# Patient Record
Sex: Male | Born: 1970 | Race: White | Hispanic: No | Marital: Married | Smoking: Never smoker
Health system: Southern US, Community
[De-identification: ages and names within clinical notes are randomized; demographics above are authoritative.]

## PROBLEM LIST (undated history)

## (undated) DIAGNOSIS — K219 Gastro-esophageal reflux disease without esophagitis: Secondary | ICD-10-CM

## (undated) DIAGNOSIS — J45909 Unspecified asthma, uncomplicated: Secondary | ICD-10-CM

## (undated) DIAGNOSIS — I10 Essential (primary) hypertension: Secondary | ICD-10-CM

## (undated) HISTORY — PX: SMALL INTESTINE SURGERY: SHX150

## (undated) HISTORY — PX: APPENDECTOMY: SHX54

---

## 2008-04-15 ENCOUNTER — Inpatient Hospital Stay: Payer: Self-pay | Admitting: Surgery

## 2012-10-02 ENCOUNTER — Emergency Department: Payer: Self-pay | Admitting: Emergency Medicine

## 2012-10-02 LAB — COMPREHENSIVE METABOLIC PANEL
Albumin: 4 g/dL (ref 3.4–5.0)
Anion Gap: 6 — ABNORMAL LOW (ref 7–16)
Chloride: 107 mmol/L (ref 98–107)
Creatinine: 1.46 mg/dL — ABNORMAL HIGH (ref 0.60–1.30)
EGFR (African American): >60
Glucose: 138 mg/dL — ABNORMAL HIGH (ref 65–99)
Osmolality: 278 (ref 275–301)
Potassium: 4.2 mmol/L (ref 3.5–5.1)
SGOT(AST): 25 U/L (ref 15–37)
SGPT (ALT): 38 U/L (ref 12–78)
Total Protein: 8.4 g/dL — ABNORMAL HIGH (ref 6.4–8.2)

## 2012-10-02 LAB — URINALYSIS, COMPLETE
Bilirubin,UR: NEGATIVE
Glucose,UR: NEGATIVE mg/dL (ref 0–75)
Ketone: NEGATIVE
Leukocyte Esterase: NEGATIVE
Ph: 5 (ref 4.5–8.0)
RBC,UR: 10 /HPF (ref 0–5)
Squamous Epithelial: NONE SEEN

## 2012-10-02 LAB — CBC
MCH: 29.7 pg (ref 26.0–34.0)
MCV: 85 fL (ref 80–100)
Platelet: 207 10*3/uL (ref 150–440)
RBC: 5.29 10*6/uL (ref 4.40–5.90)
RDW: 12.9 % (ref 11.5–14.5)
WBC: 7 10*3/uL (ref 3.8–10.6)

## 2014-07-12 ENCOUNTER — Ambulatory Visit: Payer: Self-pay | Admitting: Physician Assistant

## 2014-09-07 IMAGING — CT CT ABD-PELV W/ CM
1 of 2 series · 15 of 32 positions shown, 19 images · IV contrast (isovue)
Comparison: none

REASON FOR EXAM: (1) abd pain - R side - hx of abscesses/fistulas in the
past.  Hx of bowel resec
COMMENTS:

PROCEDURE:     CT  - CT ABDOMEN / PELVIS  W  - October 02, 2012 [DATE]
RESULT:     Comparison:  04/15/2008
TECHNIQUE: Multiple axial images of the abdomen and pelvis were performed
from the lung bases to the pubic symphysis, with p.o. contrast and with 85
mL of Isovue 370 intravenous contrast.

[Series 2: 3mm soft tissue · axial · 0.88mm/px · z∈[-911,-476]mm · 15 of 159 slices shown, 19 images]
[im 7/159  soft-tissue]
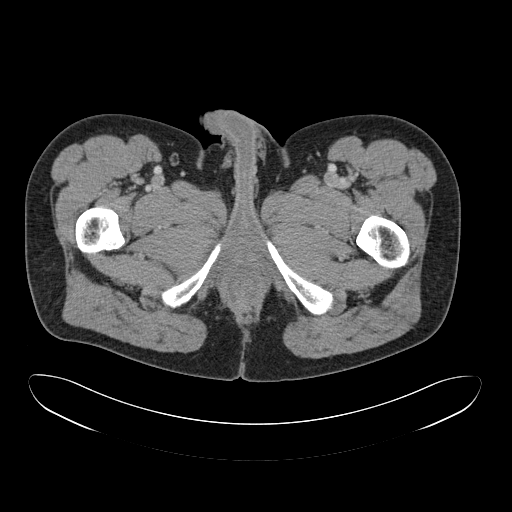
[im 7/159  bone]
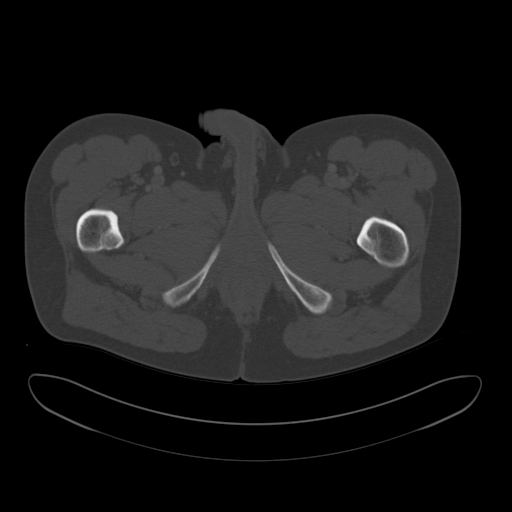
[im 21/159  soft-tissue]
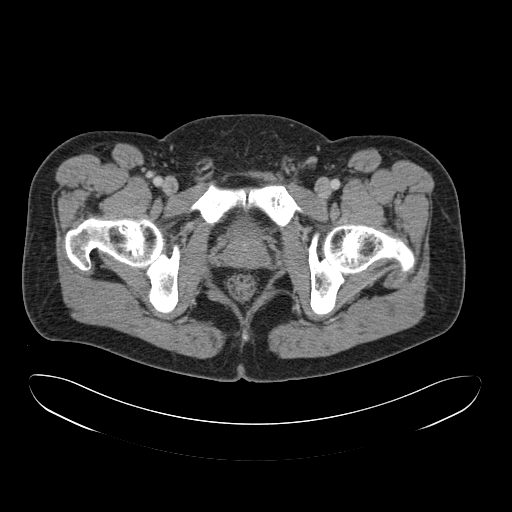
[im 35/159  soft-tissue]
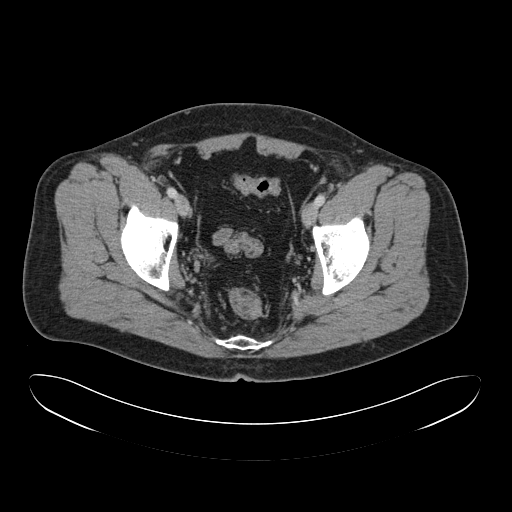
[im 42/159  soft-tissue]
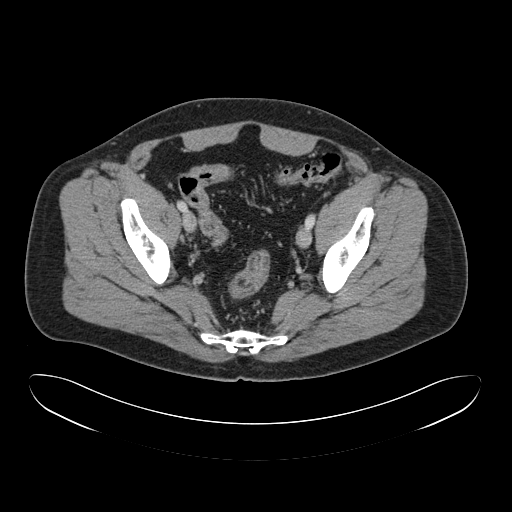
[im 55/159  soft-tissue]
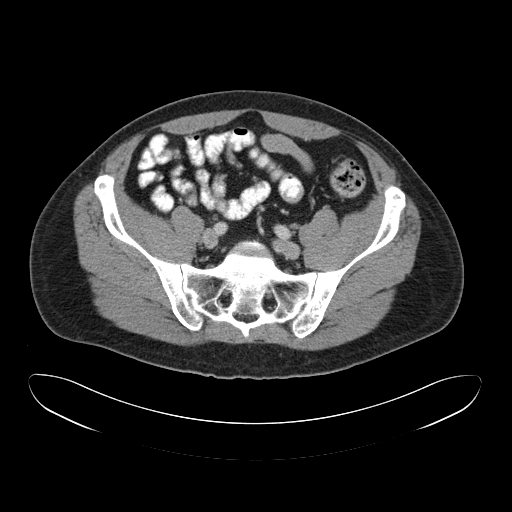
[im 69/159  soft-tissue]
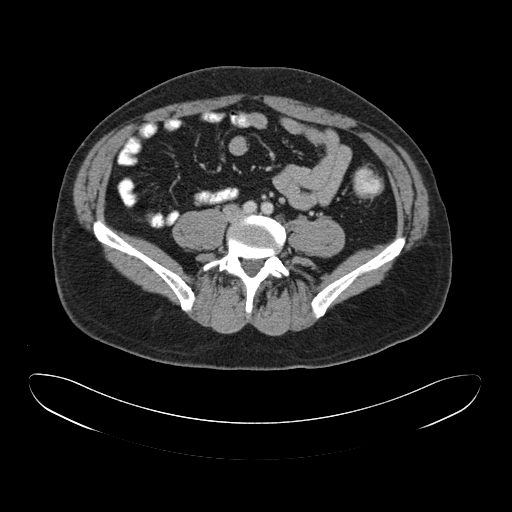
[im 83/159  soft-tissue]
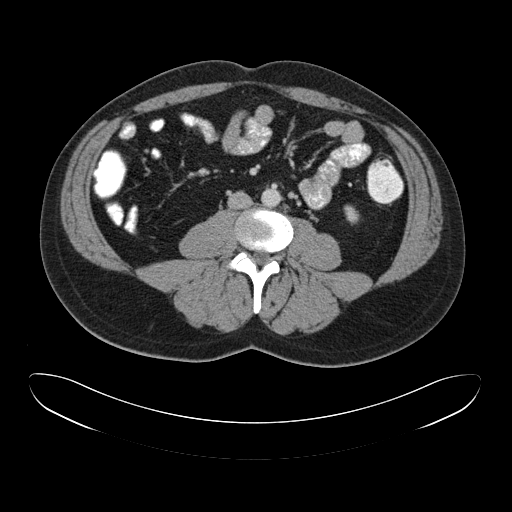
[im 90/159  soft-tissue]
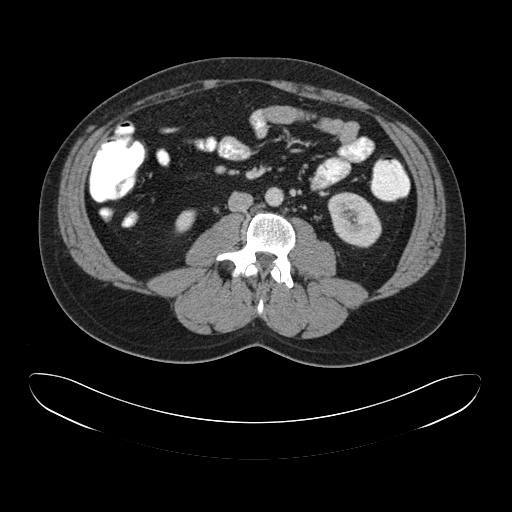
[im 104/159  soft-tissue]
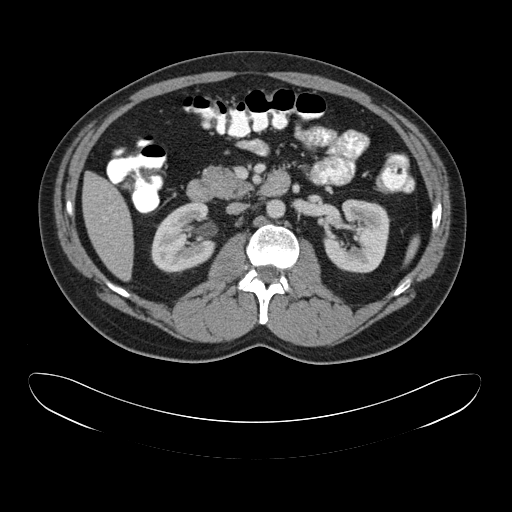
[im 104/159  bone]
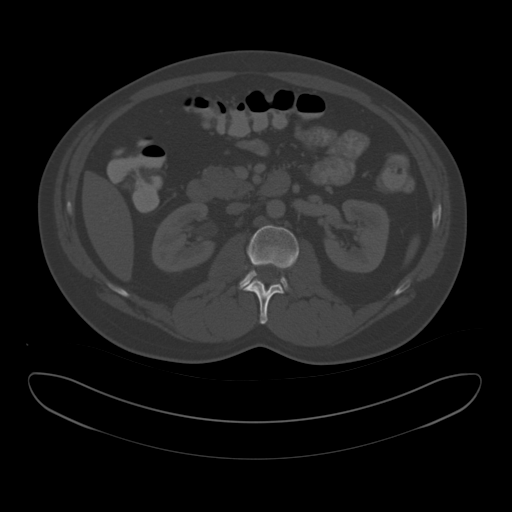
[im 117/159  soft-tissue]
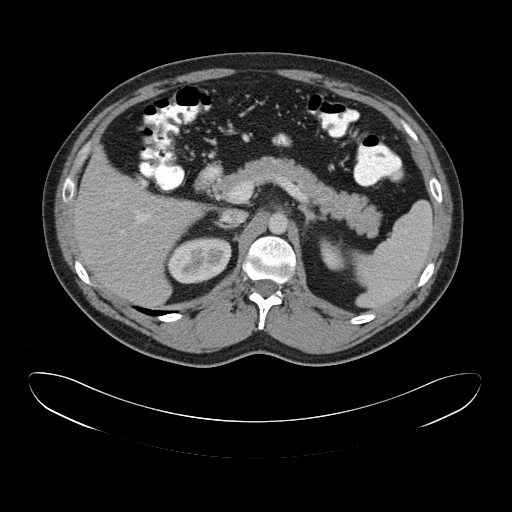
[im 124/159  soft-tissue]
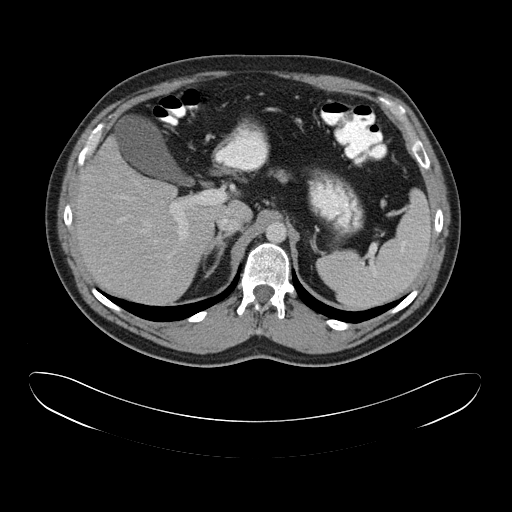
[im 131/159  lung]
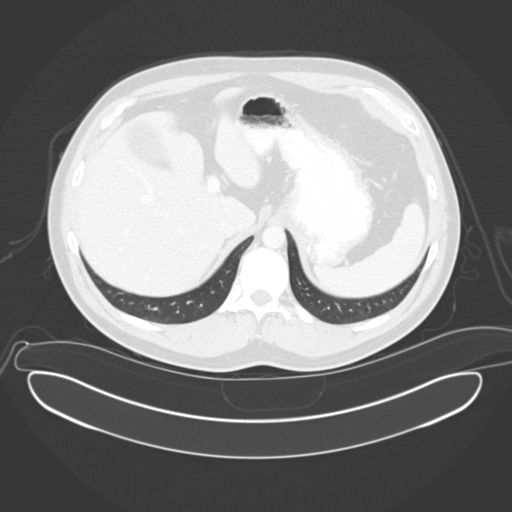
[im 138/159  soft-tissue]
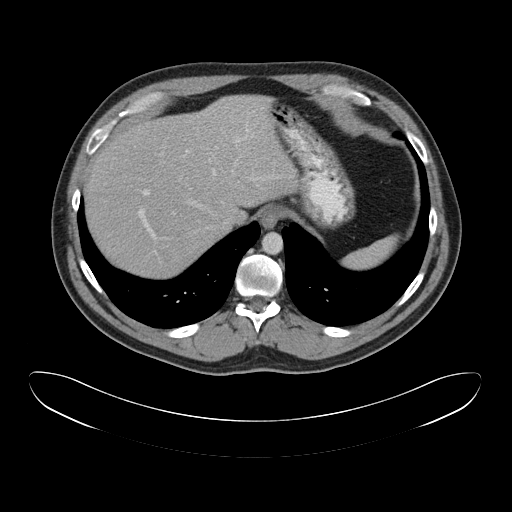
[im 138/159  lung]
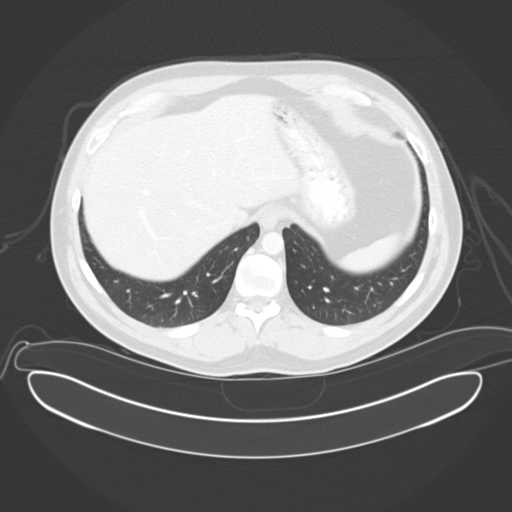
[im 145/159  lung]
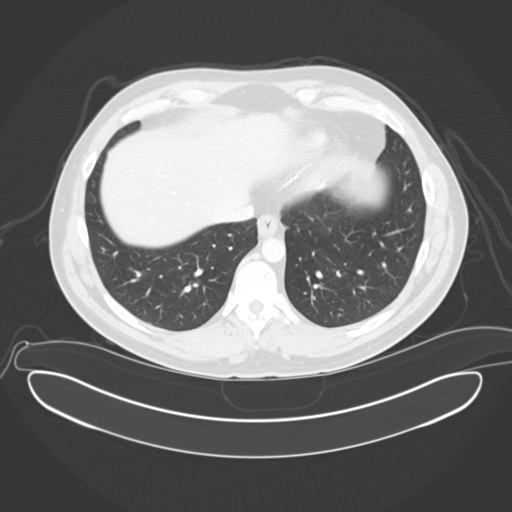
[im 152/159  soft-tissue]
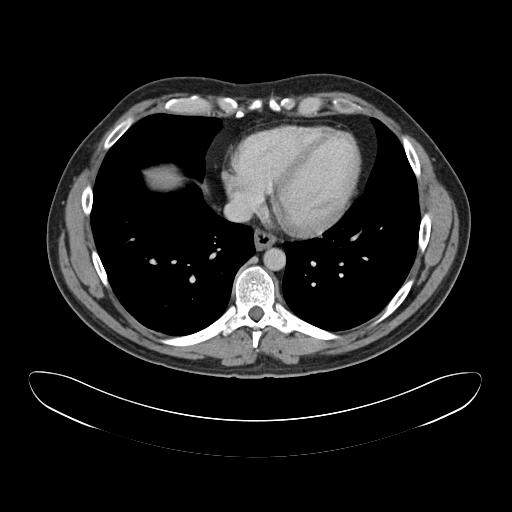
[im 152/159  lung]
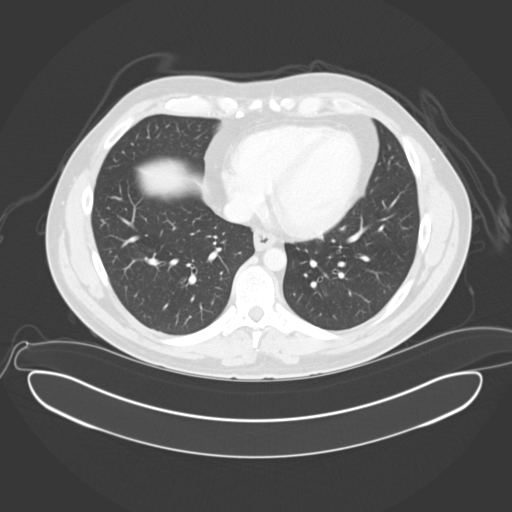

[15 of 32 positions shown; findings below may reference images not displayed]

FINDINGS: There is a 5 mm nodule in the right lower lobe which appears increased in
size from prior.

The liver, gallbladder, spleen, adrenals, and pancreas are unremarkable. The
kidneys enhance normally. There is a 4 mm calculus in the proximal right
ureter with minimal dilatation of the proximal right ureter and renal
pelvis.

The small and large bowel are normal in caliber. The appendix is not
visualized. However, there are no inflammatory changes at the base of the
cecum.

No aggressive lytic or sclerotic osseous lesions are identified.
IMPRESSION: 1. There is a 4 mm calculus in the proximal right ureter causing minimal
dilatation of the proximal right ureter and renal pelvis.
2. There is a 5 mm nodule in the right lower lobe which appears increased in
conspicuity from prior, though this could be secondary to differences in
slice selection. Followup noncontrast chest CT is recommended in 3-6 months.

## 2016-12-06 ENCOUNTER — Emergency Department
Admission: EM | Admit: 2016-12-06 | Discharge: 2016-12-06 | Disposition: A | Discharge status: Home / Self Care | Payer: BLUE CROSS/BLUE SHIELD | Attending: Emergency Medicine | Admitting: Emergency Medicine

## 2016-12-06 ENCOUNTER — Emergency Department: Payer: BLUE CROSS/BLUE SHIELD

## 2016-12-06 ENCOUNTER — Encounter: Payer: Self-pay | Admitting: Emergency Medicine

## 2016-12-06 DIAGNOSIS — M25511 Pain in right shoulder: Secondary | ICD-10-CM | POA: Insufficient documentation

## 2016-12-06 HISTORY — DX: Unspecified asthma, uncomplicated: J45.909

## 2016-12-06 MED ORDER — OXYCODONE-ACETAMINOPHEN 5-325 MG PO TABS
1.0000 | ORAL_TABLET | Freq: Once | ORAL | Status: AC
  Administered 2016-12-06: 1 via ORAL

## 2016-12-06 MED ORDER — OXYCODONE-ACETAMINOPHEN 5-325 MG PO TABS
ORAL_TABLET | ORAL | Status: AC
  Filled 2016-12-06: qty 1

## 2016-12-06 MED ORDER — LIDOCAINE 5 % EX PTCH
MEDICATED_PATCH | CUTANEOUS | Status: AC
  Administered 2016-12-06: 1
  Filled 2016-12-06: qty 1

## 2016-12-06 MED ORDER — KETOROLAC TROMETHAMINE 10 MG PO TABS: 10.0000 mg | ORAL_TABLET | Freq: Four times a day (QID) | ORAL | 0 refills | Status: AC | PRN

## 2016-12-06 MED ORDER — LIDOCAINE 5 % EX PTCH
2.0000 | MEDICATED_PATCH | CUTANEOUS | Status: DC
  Administered 2016-12-06: 1 via TRANSDERMAL
  Filled 2016-12-06: qty 2

## 2016-12-06 NOTE — ED Provider Notes (Signed)
[facility] Emergency Department Provider Note   First MD Initiated Contact with Patient 12/06/16 [phone]     (approximate)  I have reviewed the triage vital signs and the nursing notes.   HISTORY  Chief Complaint Shoulder Pain    HPI Randy Jacobs is a 46 y.o. male presents with7 out of 10 nontraumatic right shoulder pain 2 days. Patient denies any dyspnea no chest pain. Patient states the pain is worse with any movement of the right shoulder. Patient admits to taken ibuprofen before arrival with improvement of pain. Patient denies any known injury   Past Medical History:  Diagnosis Date  . Asthma     There are no active problems to display for this patient.   Past Surgical History:  Procedure Laterality Date  . SMALL INTESTINE SURGERY      Prior to Admission medications   Medication Sig Start Date End Date Taking? Authorizing Provider  ketorolac (TORADOL) 10 MG tablet Take 1 tablet (10 mg total) by mouth every 6 (six) hours as needed. 12/06/16   Gregor Hams, MD    Allergies No known drug allergies No family history on file.  Social History Social History  Substance Use Topics  . Smoking status: Never Smoker  . Smokeless tobacco: Never Used  . Alcohol use No    Review of Systems Constitutional: No fever/chills Eyes: No visual changes. ENT: No sore throat. Cardiovascular: Denies chest pain. Respiratory: Denies shortness of breath. Gastrointestinal: No abdominal pain.  No nausea, no vomiting.  No diarrhea.  No constipation. Genitourinary: Negative for dysuria. Musculoskeletal: Negative for neck pain.  Negative for back pain.Positive for right shoulder pain Integumentary: Negative for rash. Neurological: Negative for headaches, focal weakness or numbness.   ____________________________________________   PHYSICAL EXAM:  VITAL SIGNS: ED Triage Vitals  Enc Vitals Group     BP 12/06/16 0143 140/88     Pulse Rate  12/06/16 0143 83     Resp 12/06/16 0143 18     Temp 12/06/16 0143 99.1 F (37.3 C)     Temp Source 12/06/16 0143 Oral     SpO2 12/06/16 0143 97 %     Weight 12/06/16 0144 83.9 kg (185 lb)     Height 12/06/16 0144 1.727 m (5\' 8" )     Head Circumference --      Peak Flow --      Pain Score 12/06/16 0143 7     Pain Loc --      Pain Edu? --      Excl. in [place]? --     Constitutional: Alert and oriented. Apparent discomfort Mouth/Throat: Mucous membranes are moist. Neck: No stridor.   Cardiovascular: Normal rate, regular rhythm. Good peripheral circulation. Grossly normal heart sounds. Respiratory: Normal respiratory effort.  No retractions. Lungs CTAB. Gastrointestinal: Soft and nontender. No distention.  Musculoskeletal: Pain with palpation of the anterior portion of the right shoulder in the area of the subscapularis Skin:  Skin is warm, dry and intact. No rash noted. Psychiatric: Mood and affect are normal. Speech and behavior are normal.    RADIOLOGY I, [place], personally viewed and evaluated these images (plain radiographs) as part of my medical decision making, as well as reviewing the written report by the radiologist.  Dg Shoulder Right  Result Date: 12/06/2016 CLINICAL DATA:  [name] right shoulder pain since Tuesday. No known injury. EXAM: RIGHT SHOULDER - 2+ VIEW COMPARISON:  None. FINDINGS: There is no evidence of fracture or  dislocation. There is no evidence of arthropathy or other focal bone abnormality. Soft tissues are unremarkable. IMPRESSION: Negative. Electronically Signed   By: Lucienne Capers M.D.   On: 12/06/2016 02:19      Procedures   ____________________________________________   INITIAL IMPRESSION / ASSESSMENT AND PLAN / ED COURSE  Pertinent labs & imaging results that were available during my care of the patient were reviewed by me and considered in my medical decision making (see chart for details).  46 year old male presenting with  anterior right shoulder pain. History physical exam concerning for possible rotator cuff injury versus bursitis. Lidoderm patch applied patient given Percocet in the emergency department. We'll prescribe Toradol for home. Patient will be referred to orthopedic surgery for further outpatient evaluation and management.      ____________________________________________  FINAL CLINICAL IMPRESSION(S) / ED DIAGNOSES  Final diagnoses:  Acute pain of right shoulder     MEDICATIONS GIVEN DURING THIS VISIT:  Medications  lidocaine (LIDODERM) 5 % (not administered)  lidocaine (LIDODERM) 5 % 2 patch (1 patch Transdermal Patch Applied 12/06/16 0345)  oxyCODONE-acetaminophen (PERCOCET/ROXICET) 5-325 MG per tablet 1 tablet (1 tablet Oral Given 12/06/16 0343)     NEW OUTPATIENT MEDICATIONS STARTED DURING THIS VISIT:  New Prescriptions   KETOROLAC (TORADOL) 10 MG TABLET    Take 1 tablet (10 mg total) by mouth every 6 (six) hours as needed.    Modified Medications   No medications on file    Discontinued Medications   No medications on file     Note:  This document was prepared using Dragon voice recognition software and may include unintentional dictation errors.    Gregor Hams, MD 12/06/16 [phone]

## 2016-12-06 NOTE — ED Triage Notes (Signed)
Patient ambulatory to triage with steady gait, without difficulty or distress noted; pt reports since Tuesday having sharp pain to right shoulder; denies hx of same; denies any known injury st pain with ROM

## 2018-11-11 IMAGING — CR DG SHOULDER 2+V*R*
3 series · 3 of 3 positions shown · non-contrast
Comparison: None.

CLINICAL DATA: Sharp right shoulder pain since [REDACTED]. No known
injury.

EXAM:
RIGHT SHOULDER - 2+ VIEW

[shoulder grashey]
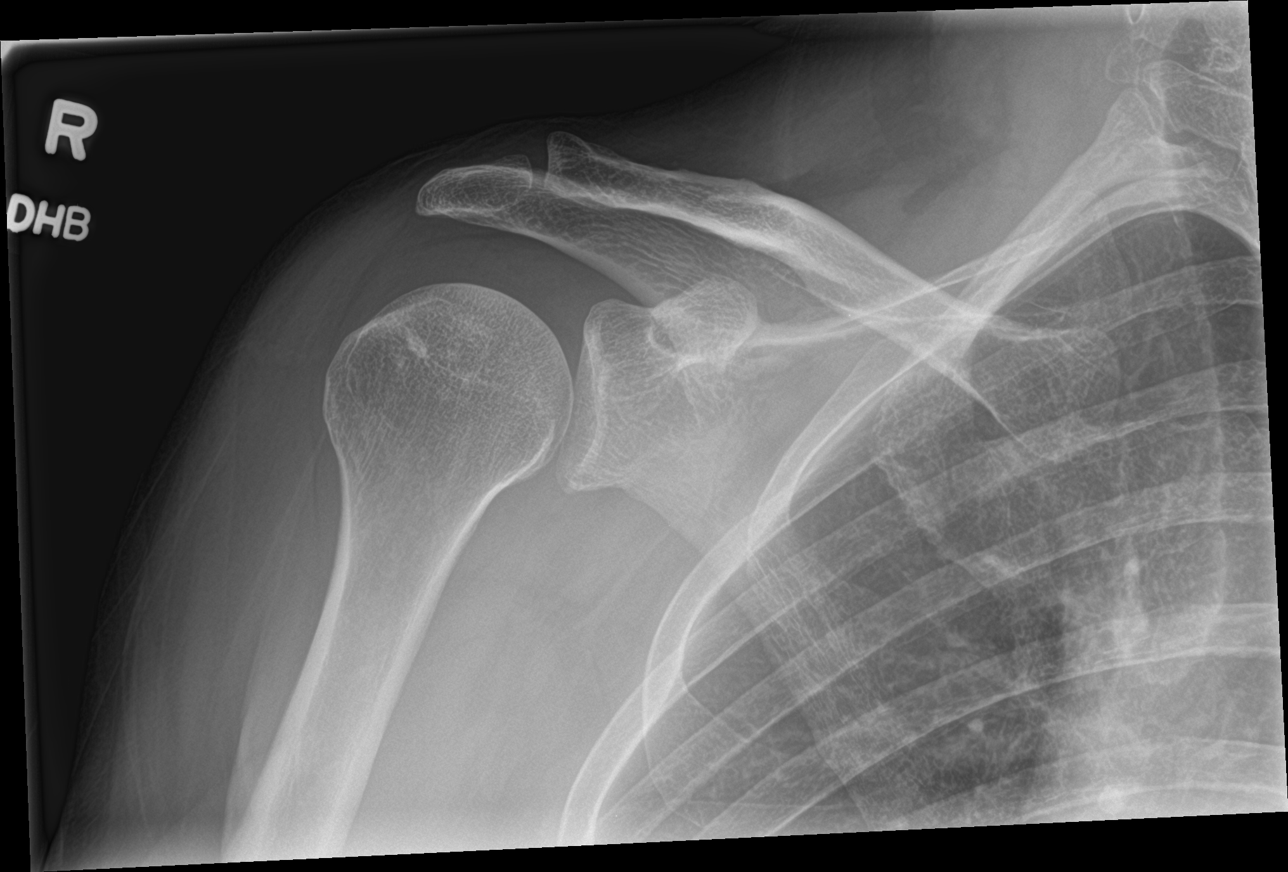

[shoulder y view]
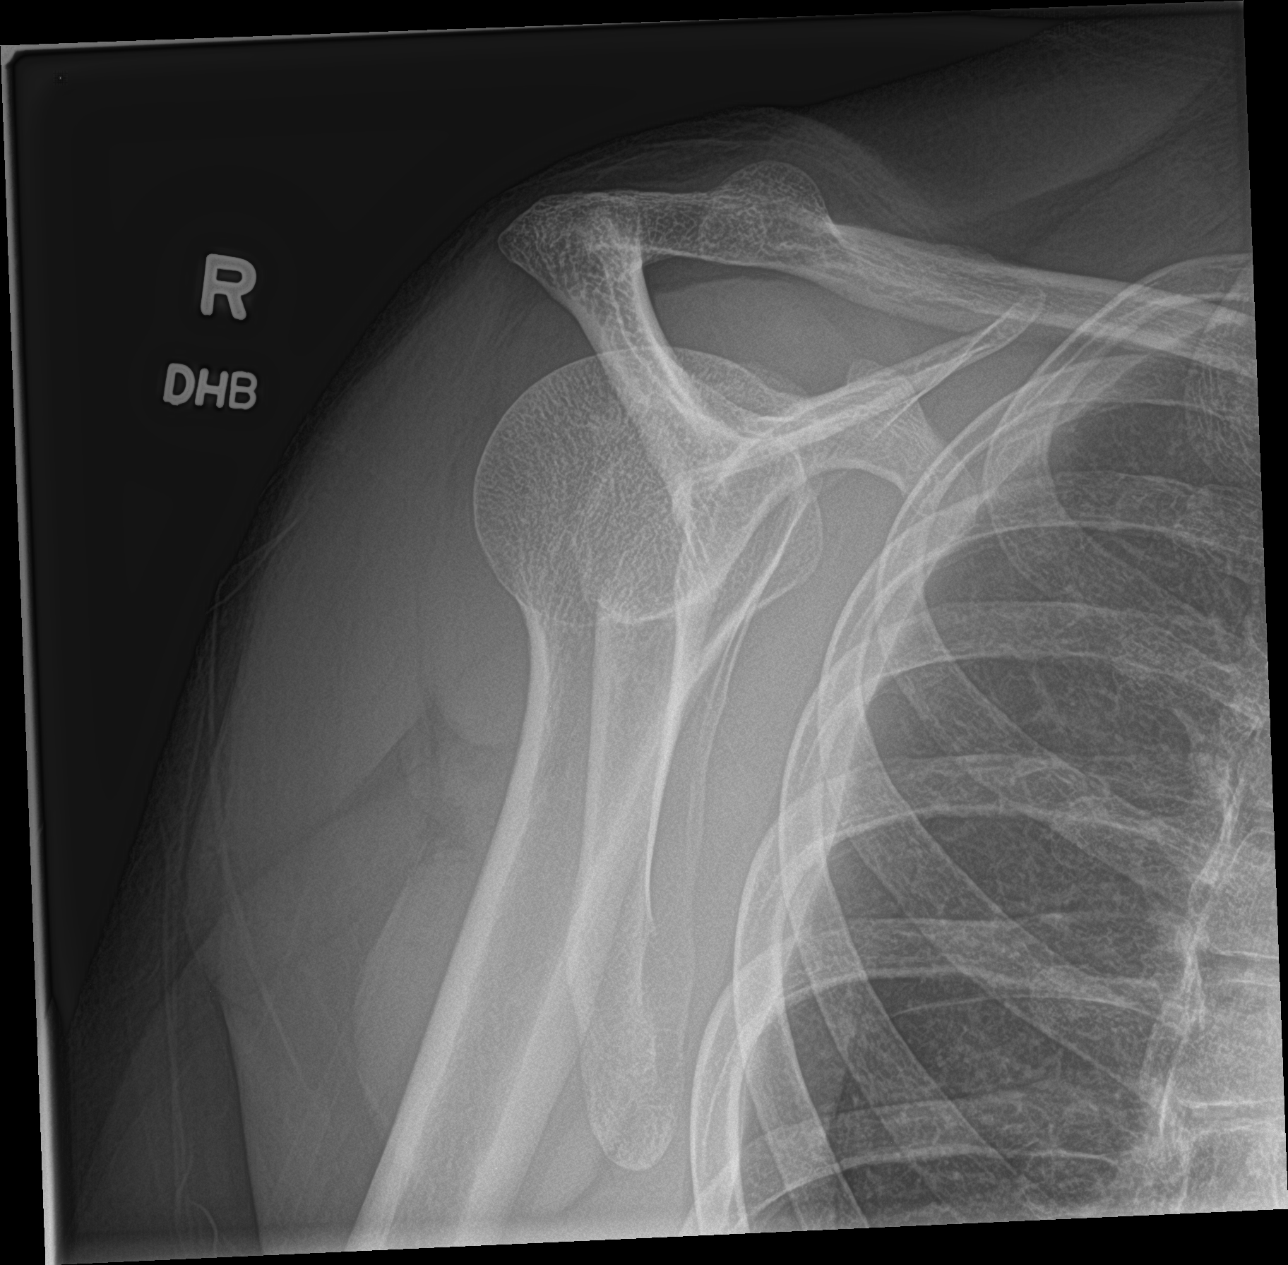

[shoulder axillary]
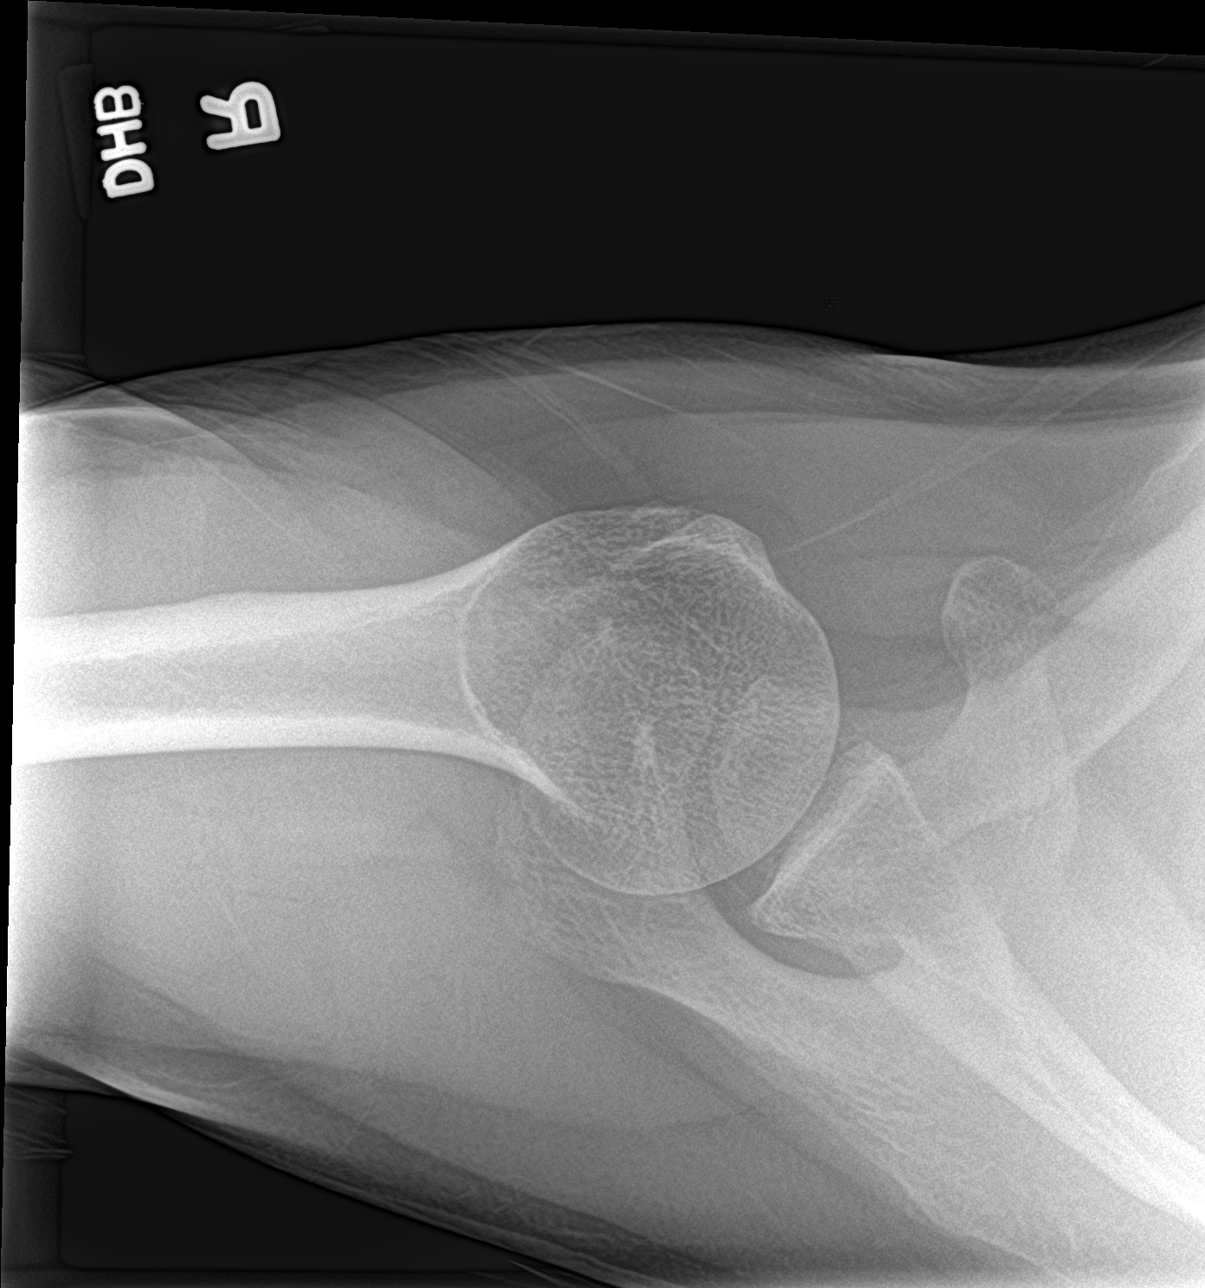

[3 of 3 positions shown; findings below may reference images not displayed]

FINDINGS: There is no evidence of fracture or dislocation. There is no
evidence of arthropathy or other focal bone abnormality. Soft
tissues are unremarkable.
IMPRESSION: Negative.

## 2020-09-22 ENCOUNTER — Encounter: Payer: Self-pay | Admitting: *Deleted

## 2020-09-23 ENCOUNTER — Other Ambulatory Visit: Payer: Self-pay

## 2020-09-23 ENCOUNTER — Encounter
Admission: RE | Disposition: A | Discharge status: Home / Self Care | Payer: Self-pay | Source: Home / Self Care | Attending: Gastroenterology

## 2020-09-23 ENCOUNTER — Ambulatory Visit: Payer: Managed Care, Other (non HMO) | Admitting: Certified Registered"

## 2020-09-23 ENCOUNTER — Ambulatory Visit
Admission: RE | Admit: 2020-09-23 | Discharge: 2020-09-23 | Disposition: A | Discharge status: Home / Self Care | Payer: Managed Care, Other (non HMO) | Attending: Gastroenterology | Admitting: Gastroenterology

## 2020-09-23 ENCOUNTER — Encounter: Payer: Self-pay | Admitting: *Deleted

## 2020-09-23 DIAGNOSIS — Z79899 Other long term (current) drug therapy: Secondary | ICD-10-CM | POA: Insufficient documentation

## 2020-09-23 DIAGNOSIS — Z980 Intestinal bypass and anastomosis status: Secondary | ICD-10-CM | POA: Insufficient documentation

## 2020-09-23 DIAGNOSIS — D123 Benign neoplasm of transverse colon: Secondary | ICD-10-CM | POA: Diagnosis not present

## 2020-09-23 DIAGNOSIS — D125 Benign neoplasm of sigmoid colon: Secondary | ICD-10-CM | POA: Diagnosis not present

## 2020-09-23 DIAGNOSIS — K635 Polyp of colon: Secondary | ICD-10-CM | POA: Insufficient documentation

## 2020-09-23 DIAGNOSIS — Z79890 Hormone replacement therapy (postmenopausal): Secondary | ICD-10-CM | POA: Diagnosis not present

## 2020-09-23 DIAGNOSIS — K6389 Other specified diseases of intestine: Secondary | ICD-10-CM | POA: Diagnosis not present

## 2020-09-23 DIAGNOSIS — Z1211 Encounter for screening for malignant neoplasm of colon: Secondary | ICD-10-CM | POA: Diagnosis present

## 2020-09-23 HISTORY — PX: COLONOSCOPY WITH PROPOFOL: SHX5780

## 2020-09-23 HISTORY — DX: Gastro-esophageal reflux disease without esophagitis: K21.9

## 2020-09-23 HISTORY — DX: Essential (primary) hypertension: I10

## 2020-09-23 SURGERY — COLONOSCOPY WITH PROPOFOL
Anesthesia: General

## 2020-09-23 MED ORDER — LIDOCAINE HCL (CARDIAC) PF 100 MG/5ML IV SOSY
PREFILLED_SYRINGE | INTRAVENOUS | Status: DC | PRN
  Administered 2020-09-23: 40 mg via INTRAVENOUS

## 2020-09-23 MED ORDER — PROPOFOL 10 MG/ML IV BOLUS
INTRAVENOUS | Status: DC | PRN
  Administered 2020-09-23: 80 mg via INTRAVENOUS
  Administered 2020-09-23: 60 mg via INTRAVENOUS

## 2020-09-23 MED ORDER — PROPOFOL 500 MG/50ML IV EMUL
INTRAVENOUS | Status: DC | PRN
  Administered 2020-09-23: 125 ug/kg/min via INTRAVENOUS

## 2020-09-23 MED ORDER — SODIUM CHLORIDE 0.9 % IV SOLN
INTRAVENOUS | Status: DC
  Administered 2020-09-23: 1000 mL via INTRAVENOUS

## 2020-09-23 NOTE — Transfer of Care (Signed)
Immediate Anesthesia Transfer of Care Note  Patient: Randy Jacobs  Procedure(s) Performed: COLONOSCOPY WITH PROPOFOL (N/A )  Patient Location: PACU  Anesthesia Type:General  Level of Consciousness: awake, alert  and oriented  Airway & Oxygen Therapy: Patient Spontanous Breathing  Post-op Assessment: Report given to RN and Post -op Vital signs reviewed and stable  Post vital signs: Reviewed and stable  Last Vitals:  Vitals Value Taken Time  BP 125/93 09/23/20 1500  Temp    Pulse 95 09/23/20 1501  Resp 17 09/23/20 1501  SpO2 98 % 09/23/20 1501  Vitals shown include unvalidated device data.  Last Pain:  Vitals:   09/23/20 1359  TempSrc: Temporal  PainSc: 0-No pain      Patients Stated Pain Goal: 0 (35/70/17 7939)  Complications: No complications documented.

## 2020-09-23 NOTE — H&P (Signed)
Outpatient short stay form Pre-procedure 09/23/2020 2:27 PM Raylene Miyamoto MD, MPH  Primary Physician: Dr. 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 No diagnosis of crohns. Here for screening colonoscopy. No family history of GI malignancies. 7 perianal surgeries for fistulas. Has chronic loose stools. No blood thinners. No significant symptoms.    Current Facility-Administered Medications:  .  0.9 %  sodium chloride infusion, , Intravenous, Continuous, Martita Brumm, Hilton Cork, MD, Last Rate: 20 mL/hr at 09/23/20 1421, 1,000 mL at 09/23/20 1421  Medications Prior to Admission  Medication Sig Dispense Refill Last Dose  . albuterol (VENTOLIN HFA) 108 (90 Base) MCG/ACT inhaler Inhale 2 puffs into the lungs every 6 (six) hours as needed for wheezing or shortness of breath.   Past Week at Unknown time  . fluticasone (FLONASE) 50 MCG/ACT nasal spray Place 2 sprays into both nostrils daily.   09/22/2020 at 0900 pm  . levothyroxine (SYNTHROID) 75 MCG tablet Take 75 mcg by mouth daily before breakfast.   09/23/2020 at 0700am  . omeprazole (PRILOSEC) 10 MG capsule Take 10 mg by mouth every 36 (thirty-six) hours.   Past Week at Unknown time  . fluticasone-salmeterol (ADVAIR) 100-50 MCG/ACT AEPB Inhale 1 puff into the lungs 2 (two) times daily. (Patient not taking: Reported on 09/23/2020)   Completed Course at Unknown time  . ketorolac (TORADOL) 10 MG tablet Take 1 tablet (10 mg total) by mouth every 6 (six) hours as needed. 20 tablet 0      No Known Allergies   Past Medical History:  Diagnosis Date  . Asthma   . GERD (gastroesophageal reflux disease)   . Hypertension     Review of systems:  Otherwise negative.    Physical Exam  Gen: Alert, oriented. Appears stated age.  HEENT: PERRLA. Lungs: No respiratory distress CV: RRR Abd: soft,  benign, no masses Ext: No edema    Planned procedures: Proceed with colonoscopy. The patient understands the nature of the planned procedure, indications, risks, alternatives and potential complications including but not limited to bleeding, infection, perforation, damage to internal organs and possible oversedation/side effects from anesthesia. The patient agrees and gives consent to proceed.  Please refer to procedure notes for findings, recommendations and patient disposition/instructions.     Raylene Miyamoto MD, MPH Gastroenterology 09/23/2020  2:27 PM

## 2020-09-23 NOTE — Anesthesia Preprocedure Evaluation (Signed)
Anesthesia Evaluation  Patient identified by MRN, date of birth, ID band Patient awake    Reviewed: Allergy & Precautions, NPO status , Patient's Chart, lab work & pertinent test results  Airway Mallampati: II  TM Distance: >3 FB     Dental   Pulmonary asthma ,    Pulmonary exam normal        Cardiovascular hypertension, Normal cardiovascular exam     Neuro/Psych negative neurological ROS  negative psych ROS   GI/Hepatic Neg liver ROS, GERD  ,  Endo/Other  negative endocrine ROS  Renal/GU negative Renal ROS  negative genitourinary   Musculoskeletal negative musculoskeletal ROS (+)   Abdominal   Peds negative pediatric ROS (+)  Hematology negative hematology ROS (+)   Anesthesia Other Findings Past Medical History: No date: Asthma No date: GERD (gastroesophageal reflux disease) No date: Hypertension  Reproductive/Obstetrics                             Anesthesia Physical Anesthesia Plan  ASA: II  Anesthesia Plan: General   Post-op Pain Management:    Induction: Intravenous  PONV Risk Score and Plan: Propofol infusion  Airway Management Planned: Nasal Cannula  Additional Equipment:   Intra-op Plan:   Post-operative Plan:   Informed Consent: I have reviewed the patients History and Physical, chart, labs and discussed the procedure including the risks, benefits and alternatives for the proposed anesthesia with the patient or authorized representative who has indicated his/her understanding and acceptance.     Dental advisory given  Plan Discussed with: CRNA and Surgeon  Anesthesia Plan Comments:         Anesthesia Quick Evaluation

## 2020-09-23 NOTE — Interval H&P Note (Signed)
History and Physical Interval Note:  09/23/2020 2:30 PM  Randy Jacobs  has presented today for surgery, with the diagnosis of SCREENING.  The various methods of treatment have been discussed with the patient and family. After consideration of risks, benefits and other options for treatment, the patient has consented to  Procedure(s): COLONOSCOPY WITH PROPOFOL (N/A) as a surgical intervention.  The patient's history has been reviewed, patient examined, no change in status, stable for surgery.  I have reviewed the patient's chart and labs.  Questions were answered to the patient's satisfaction.     Lesly Rubenstein  Ok to proceed with colonoscopy

## 2020-09-23 NOTE — Op Note (Signed)
[facility] Gastroenterology Patient Name: Randy Jacobs Procedure Date: 09/23/2020 2:27 PM MRN: [id number] Account #: [id number] Date of Birth: November 20, 1970 Admit Type: Outpatient Age: 50 Room: [facility] ENDO ROOM 3 Gender: Male Note Status: Finalized Procedure:             Colonoscopy Indications:           Screening for colorectal malignant neoplasm Providers:             Andrey Farmer MD, MD Referring MD:          Ocie Cornfield. Ouida Sills MD, MD (Referring MD) Medicines:             Monitored Anesthesia Care Complications:         No immediate complications. Estimated blood loss:                         Minimal. Procedure:             Pre-Anesthesia Assessment:                        - Prior to the procedure, a History and Physical was                         performed, and patient medications and allergies were                         reviewed. The patient is competent. The risks and                         benefits of the procedure and the sedation options and                         risks were discussed with the patient. All questions                         were answered and informed consent was obtained.                         Patient identification and proposed procedure were                         verified by the physician, the nurse, the anesthetist                         and the technician in the endoscopy suite. Mental                         Status Examination: alert and oriented. Airway                         Examination: normal oropharyngeal airway and neck                         mobility. Respiratory Examination: clear to                         auscultation. CV Examination: normal. Prophylactic  Antibiotics: The patient does not require prophylactic                         antibiotics. Prior Anticoagulants: The patient has                         taken no previous anticoagulant or antiplatelet                         agents.  ASA Grade Assessment: II - A patient with mild                         systemic disease. After reviewing the risks and                         benefits, the patient was deemed in satisfactory                         condition to undergo the procedure. The anesthesia                         plan was to use monitored anesthesia care (MAC).                         Immediately prior to administration of medications,                         the patient was re-assessed for adequacy to receive                         sedatives. The heart rate, respiratory rate, oxygen                         saturations, blood pressure, adequacy of pulmonary                         ventilation, and response to care were monitored                         throughout the procedure. The physical status of the                         patient was re-assessed after the procedure.                        After obtaining informed consent, the colonoscope was                         passed under direct vision. Throughout the procedure,                         the patient's blood pressure, pulse, and oxygen                         saturations were monitored continuously. The                         Colonoscope was introduced through the anus and  advanced to the the ileocolonic anastomosis. The                         colonoscopy was performed without difficulty. The                         patient tolerated the procedure well. The quality of                         the bowel preparation was good. Findings:      The perianal and digital rectal examinations were normal.      The neo-terminal ileum appeared normal.      There was evidence of a prior end-to-end ileo-colonic anastomosis at the       ileocecal valve. This was patent and was characterized by healthy       appearing mucosa.      A localized area of granular mucosa was found in the cecum. Biopsies       were taken with a cold forceps for  histology. This appeared as       transition mucosa from ileum to colon. Estimated blood loss was minimal.      A 1 mm polyp was found in the ascending colon. The polyp was sessile.       The polyp was removed with a jumbo cold forceps. Resection and retrieval       were complete. Estimated blood loss was minimal.      A 4 mm polyp was found in the proximal transverse colon. The polyp was       sessile. The polyp was removed with a cold snare. Resection and       retrieval were complete. Estimated blood loss was minimal.      A 10 mm polyp was found in the sigmoid colon. The polyp was sessile. The       polyp was removed with a hot snare. Resection and retrieval were       complete. To prevent bleeding post-intervention, two hemostatic clips       were successfully placed. There was no bleeding at the end of the       procedure.      The exam was otherwise without abnormality on direct and retroflexion       views. Impression:            - The examined portion of the ileum was normal.                        - Patent end-to-end ileo-colonic anastomosis,                         characterized by healthy appearing mucosa.                        - Granularity in the cecum. Biopsied.                        - One 1 mm polyp in the ascending colon, removed with                         a jumbo cold forceps. Resected and retrieved.                        -  One 4 mm polyp in the proximal transverse colon,                         removed with a cold snare. Resected and retrieved.                        - One 10 mm polyp in the sigmoid colon, removed with a                         hot snare. Resected and retrieved. Clips were placed.                        - The examination was otherwise normal on direct and                         retroflexion views. Recommendation:        - Discharge patient to home.                        - Resume previous diet.                        - Continue present  medications.                        - Await pathology results.                        - Repeat colonoscopy for surveillance based on                         pathology results.                        - Return to referring physician as previously                         scheduled. Procedure Code(s):     --- Professional ---                        [phone], Colonoscopy, flexible; with removal of                         tumor(s), polyp(s), or other lesion(s) by snare                         technique                        50569, 77, Colonoscopy, flexible; with biopsy, single                         or multiple Diagnosis Code(s):     --- Professional ---                        Z12.11, Encounter for screening for malignant neoplasm                         of colon  Z98.0, Intestinal bypass and anastomosis status                        K63.89, Other specified diseases of intestine                        K63.5, Polyp of colon CPT copyright 2019 [employer] Medical Association. All rights reserved. The codes documented in this report are preliminary and upon coder review may  be revised to meet current compliance requirements. Andrey Farmer MD, MD 09/23/2020 3:02:09 PM Number of Addenda: 0 Note Initiated On: 09/23/2020 2:27 PM Scope Withdrawal Time: 0 hours 16 minutes 32 seconds  Total Procedure Duration: 0 hours 20 minutes 54 seconds  Estimated Blood Loss:  Estimated blood loss was minimal.      [facility]

## 2020-09-23 NOTE — Anesthesia Postprocedure Evaluation (Signed)
Anesthesia Post Note  Patient: Randy Jacobs  Procedure(s) Performed: COLONOSCOPY WITH PROPOFOL (N/A )  Patient location during evaluation: Endoscopy Anesthesia Type: General Level of consciousness: awake and alert and oriented Pain management: pain level controlled Vital Signs Assessment: post-procedure vital signs reviewed and stable Respiratory status: spontaneous breathing, nonlabored ventilation and respiratory function stable Cardiovascular status: blood pressure returned to baseline and stable Postop Assessment: no signs of nausea or vomiting Anesthetic complications: no   No complications documented.   Last Vitals:  Vitals:   09/23/20 1500 09/23/20 1510  BP:  (!) 135/99  Pulse:  99  Resp: 16 20  Temp: 36.8 C   SpO2:      Last Pain:  Vitals:   09/23/20 1510  TempSrc:   PainSc: 0-No pain                 Elizah Mierzwa

## 2020-09-26 ENCOUNTER — Encounter: Payer: Self-pay | Admitting: Gastroenterology

## 2020-09-27 LAB — SURGICAL PATHOLOGY

## 2024-02-13 ENCOUNTER — Ambulatory Visit: Payer: Self-pay

## 2024-02-13 DIAGNOSIS — Z860101 Personal history of adenomatous and serrated colon polyps: Secondary | ICD-10-CM | POA: Diagnosis not present

## 2024-02-13 DIAGNOSIS — D123 Benign neoplasm of transverse colon: Secondary | ICD-10-CM | POA: Diagnosis not present

## 2024-02-13 DIAGNOSIS — D125 Benign neoplasm of sigmoid colon: Secondary | ICD-10-CM | POA: Diagnosis not present

## 2024-02-13 DIAGNOSIS — Z09 Encounter for follow-up examination after completed treatment for conditions other than malignant neoplasm: Secondary | ICD-10-CM | POA: Diagnosis present

## 2024-02-13 DIAGNOSIS — K635 Polyp of colon: Secondary | ICD-10-CM | POA: Diagnosis not present
